# Patient Record
Sex: Female | Born: 1990 | Hispanic: No | Marital: Single | Smoking: Current every day smoker
Health system: Southern US, Community
[De-identification: ages and names within clinical notes are randomized; demographics above are authoritative.]

## PROBLEM LIST (undated history)

## (undated) DIAGNOSIS — C801 Malignant (primary) neoplasm, unspecified: Secondary | ICD-10-CM

## (undated) DIAGNOSIS — Z789 Other specified health status: Secondary | ICD-10-CM

## (undated) HISTORY — PX: BRAIN SURGERY: SHX531

## (undated) HISTORY — PX: TYMPANOPLASTY: SHX33

## (undated) HISTORY — DX: Malignant (primary) neoplasm, unspecified: C80.1

---

## 2015-04-27 ENCOUNTER — Ambulatory Visit (INDEPENDENT_AMBULATORY_CARE_PROVIDER_SITE_OTHER): Payer: PRIVATE HEALTH INSURANCE | Admitting: Family Medicine

## 2015-04-27 VITALS — BP 104/70 | HR 84 | Temp 98.9°F | Resp 16 | Ht 63.75 in | Wt 178.2 lb

## 2015-04-27 DIAGNOSIS — N63 Unspecified lump in unspecified breast: Secondary | ICD-10-CM

## 2015-04-27 DIAGNOSIS — N912 Amenorrhea, unspecified: Secondary | ICD-10-CM | POA: Diagnosis not present

## 2015-04-27 DIAGNOSIS — Z79899 Other long term (current) drug therapy: Secondary | ICD-10-CM | POA: Diagnosis not present

## 2015-04-27 DIAGNOSIS — Z793 Long term (current) use of hormonal contraceptives: Secondary | ICD-10-CM

## 2015-04-27 LAB — POCT URINE PREGNANCY: Preg Test, Ur: NEGATIVE

## 2015-04-27 NOTE — Progress Notes (Signed)
  Subjective:  Patient ID: Virginia Holt, female    DOB: 1991-10-13  Age: 24 y.o. MRN: [id number]  24 year old lady who presents with a lump in her left breast she noticed a few days ago. She just happened to feel it. It was nontender and did not call attention to itself. She has not had any breast lumps in the past. Her last menstrual cycle was in April. She is on ninety-day cycling of birth control pills at that time. However she quit them a week or 2 ago. She has broken up with her boyfriend do not see any need for being on them any longer. She has used protection also she's had intercourse. She did run a pregnancy test or suffer couple days ago and it was negative.  The family history is positive for a great-grandmother with breast cancer and she thinks maybe an aunt had breast cancer. No first-degree relative cancers. No other symptoms or complaints. The patient is a [employer].   Objective:   Healthy-appearing young lady in no acute distress. No axillary nodes. Just below the left axilla however almost more axillary been depressed, is a 1 by a 1/2 cm moderately firm nodular area, noncystic. She has some other minimal fibrocystic nodular areas in both breasts. There is no dimpling of the skin.  Assessment & Plan:   Assessment: Left breast lump Amenorrhea secondary to contraception  Plan: Explained to her that we still need to do a pregnancy test on her. We'll schedule a breast ultrasound and evaluation of center. Results for orders placed or performed in visit on 04/27/15  POCT urine pregnancy  Result Value Ref Range   Preg Test, Ur Negative     Patient Instructions  You're being referred to the breast center for evaluation for the lump. We are ordering an ultrasound, but the may also need to mammogram.  If you do not hear back from this office regarding the referral within the next 3 days or so please call back to check and see what has become of it.     Doyt Castellana, MD  04/27/2015

## 2015-04-27 NOTE — Patient Instructions (Signed)
You're being referred to the breast center for evaluation for the lump. We are ordering an ultrasound, but the may also need to mammogram.  If you do not hear back from this office regarding the referral within the next 3 days or so please call back to check and see what has become of it.

## 2015-05-03 ENCOUNTER — Other Ambulatory Visit: Payer: Self-pay | Admitting: Family Medicine

## 2015-05-03 DIAGNOSIS — N6320 Unspecified lump in the left breast, unspecified quadrant: Secondary | ICD-10-CM

## 2015-05-06 ENCOUNTER — Ambulatory Visit (INDEPENDENT_AMBULATORY_CARE_PROVIDER_SITE_OTHER): Payer: PRIVATE HEALTH INSURANCE

## 2015-05-06 ENCOUNTER — Telehealth: Payer: Self-pay | Admitting: Physician Assistant

## 2015-05-06 ENCOUNTER — Ambulatory Visit (INDEPENDENT_AMBULATORY_CARE_PROVIDER_SITE_OTHER): Payer: PRIVATE HEALTH INSURANCE | Admitting: Physician Assistant

## 2015-05-06 VITALS — BP 98/64 | HR 91 | Temp 98.1°F | Resp 16 | Ht 63.75 in | Wt 174.6 lb

## 2015-05-06 DIAGNOSIS — R700 Elevated erythrocyte sedimentation rate: Secondary | ICD-10-CM | POA: Diagnosis not present

## 2015-05-06 DIAGNOSIS — R591 Generalized enlarged lymph nodes: Secondary | ICD-10-CM | POA: Diagnosis not present

## 2015-05-06 LAB — POCT SEDIMENTATION RATE: POCT SED RATE: 113 mm/hr — AB (ref 0–22)

## 2015-05-06 LAB — POCT CBC
Granulocyte percent: 69.2 %G (ref 37–80)
HEMATOCRIT: 40.6 % (ref 37.7–47.9)
HEMOGLOBIN: 12.6 g/dL (ref 12.2–16.2)
Lymph, poc: 1.5 (ref 0.6–3.4)
MCH, POC: 26.1 pg — AB (ref 27–31.2)
MCHC: 31.1 g/dL — AB (ref 31.8–35.4)
MCV: 83.8 fL (ref 80–97)
MID (cbc): 0.4 (ref 0–0.9)
MPV: 6.7 fL (ref 0–99.8)
POC Granulocyte: 4.4 (ref 2–6.9)
POC LYMPH PERCENT: 24.6 %L (ref 10–50)
POC MID %: 6.2 %M (ref 0–12)
Platelet Count, POC: 252 10*3/uL (ref 142–424)
RBC: 4.85 M/uL (ref 4.04–5.48)
RDW, POC: 14.6 %
WBC: 6.3 10*3/uL (ref 4.6–10.2)

## 2015-05-06 LAB — POCT URINE PREGNANCY: Preg Test, Ur: NEGATIVE

## 2015-05-06 NOTE — Progress Notes (Signed)
Patient ID: Virginia Holt, female    DOB: Jul 15, 1991, 24 y.o.   MRN: [id number]  PCP: No PCP Per Patient   Subjective:  HPI Pt is a 24 y/o female presenting to clinic for evaluation of lymphadenopathy.  Pt was seen by Dr. Linna Darner 1 week ago, when a small nodule was palpated in left breast/axillary area. Pt was sent for an ultrasound for evaluation. Ultrasound showed evidence of several small masses in the right axilla and a 1.5cm mass in the left axilla, all suspicious for malignancy, and breast tissue was normal. Dr. Marcelo Baldy at [facility], who read the ultrasound, contacted [name], PA-C to discuss performing an ultrasound guided biopsy, and it was decided that she should undergo evaluation for lymphadenopathy before performing a biopsy.   Pt states that she has been feeling fatigued for about a week, but she attributed that to working more. She also feels as though she has been more hungry and thirsty that usual, but has lost 5 lbs unintentionally over the last month. The skin around both nipples has been more dry than usual for the past 2 weeks, but she is experiencing no breast pain or nipple discharge. She also experienced some dizziness, light-headedness, and nausea last night, which lasted about 3 hours. She did not associated anything with the occurrence of this episode, and it resolved on its own. She has not experienced another episode like this. Of note, pt was diagnosed with brain cancer and had brain surgery at age 8, but doesn't really remember anything and has no further information to share.  She states that her tonsils have felt swollen, and her left tonsil started hurting today. This is a normal occurrence for her, and it usually happens 2-3 times a year. Positive for rhinorrhea, but no other HEENT symptoms.    Review of Systems  Constitutional: Positive for appetite change (more hungry recently), fatigue (for a week) and unexpected weight change (unintentional 5  pound weight loss in the last month). Negative for fever, chills, diaphoresis and activity change.  HENT: Positive for rhinorrhea. Negative for congestion, dental problem, drooling, ear discharge, ear pain, facial swelling, hearing loss, mouth sores, nosebleeds, postnasal drip, sinus pressure, sneezing, sore throat, tinnitus and trouble swallowing.   Eyes: Negative.   Respiratory: Negative.   Cardiovascular: Negative.   Gastrointestinal: Positive for nausea (for 3 hours last night). Negative for vomiting, abdominal pain, diarrhea, constipation, blood in stool, abdominal distention, anal bleeding and rectal pain.  Endocrine: Positive for polydipsia and polyphagia. Negative for heat intolerance and polyuria.  Genitourinary: Negative.   Musculoskeletal: Negative.   Skin: Negative.   Neurological: Positive for dizziness (for 3 hours last night) and light-headedness (for 3 hours last night). Negative for tremors, seizures, syncope, facial asymmetry, speech difficulty, weakness, numbness and headaches.  Hematological: Negative.      There are no active problems to display for this patient.   Past Medical History  Diagnosis Date  . Cancer     Prior to Admission medications   Not on File    No Known Allergies  Past Medical, Surgical Family and Social History reviewed and updated.   Objective:   Vitals: BP 98/64 mmHg  Pulse 91  Temp(Src) 98.1 F (36.7 C) (Oral)  Resp 16  Ht 5' 3.75" (1.619 m)  Wt 174 lb 9.6 oz (79.198 kg)  BMI 30.21 kg/m2  SpO2 98%  LMP 03/12/2015 (Exact Date)   Physical Exam  Constitutional: She is oriented to person, place, and time. She  appears well-developed and well-nourished. She is cooperative. No distress.  HENT:  Head: Normocephalic and atraumatic.  Right Ear: Hearing, external ear and ear canal normal. No lacerations. No drainage, swelling or tenderness. No foreign bodies. No mastoid tenderness. Tympanic membrane is scarred (HIstory of  tympanoplasty). Tympanic membrane is not injected, not perforated, not erythematous, not retracted and not bulging. No middle ear effusion. No hemotympanum. No decreased hearing is noted.  Left Ear: External ear and ear canal normal. No lacerations. No drainage, swelling or tenderness. No foreign bodies. No mastoid tenderness. 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 Tympanic membrane is not injected, not scarred, not erythematous, not retracted and not bulging. Tympanic membrane mobility is normal.  No middle ear effusion. No hemotympanum. No decreased hearing is noted.  Nose: Nose normal. Right sinus exhibits no maxillary sinus tenderness and no frontal sinus tenderness. Left sinus exhibits no maxillary sinus tenderness and no frontal sinus tenderness.  Mouth/Throat: Uvula is midline, oropharynx is clear and moist and mucous membranes are normal.  Hypertrophic tonsils, right >left. No erythema, drainage, or exudate.  Eyes: Conjunctivae and lids are normal. Pupils are equal, round, and reactive to light.  Neck: Trachea normal and phonation normal. Neck supple. No thyromegaly present.  Cardiovascular: Normal rate, regular rhythm, normal heart sounds and normal pulses.  Exam reveals no gallop and no friction rub.   No murmur heard. Pulmonary/Chest: Effort normal and breath sounds normal. No respiratory distress. She has no wheezes. She has no rales. Right breast exhibits skin change (Dry, scaly nipple). Right breast exhibits no inverted nipple, no mass, no nipple discharge and no tenderness. Left breast exhibits skin change (Dry, scaly nipple). Left breast exhibits no inverted nipple, no mass, no nipple discharge and no tenderness. Breasts are symmetrical.  Abdominal: Soft. Normal appearance and bowel sounds are normal. She exhibits no distension and no mass. There is no hepatosplenomegaly. There is no tenderness.  Stretch marks visible  Lymphadenopathy:       Head (right side):  Tonsillar adenopathy present. No submental, no submandibular, no preauricular, no posterior auricular and no occipital adenopathy present.       Head (left side): No submental, no submandibular, no tonsillar, no preauricular, no posterior auricular and no occipital adenopathy present.    She has cervical adenopathy.       Right cervical: Superficial cervical and deep cervical adenopathy present. No posterior cervical adenopathy present.      Left cervical: Superficial cervical and deep cervical adenopathy present. No posterior cervical adenopathy present.    She has axillary adenopathy.       Right axillary: No pectoral and no lateral adenopathy present.       Left axillary: Pectoral adenopathy present. No lateral adenopathy present.      Right: No supraclavicular adenopathy present.       Left: No supraclavicular adenopathy present.  Inguinal lymph nodes palpable, but not enlarged.  Neurological: She is alert and oriented to person, place, and time. She has normal reflexes.  Skin: Skin is warm, dry and intact. No rash noted.  Psychiatric: She has a normal mood and affect. Her speech is normal and behavior is normal. Thought content normal.    Results for orders placed or performed in visit on 05/06/15  POCT CBC  Result Value Ref Range   WBC 6.3 4.6 - 10.2 K/uL   Lymph, poc 1.5 0.6 - 3.4   POC LYMPH PERCENT 24.6 10 - 50 %L   MID (cbc) 0.4 0 - 0.9  POC MID % 6.2 0 - 12 %M   POC Granulocyte 4.4 2 - 6.9   Granulocyte percent 69.2 37 - 80 %G   RBC 4.85 4.04 - 5.48 M/uL   Hemoglobin 12.6 12.2 - 16.2 g/dL   HCT, POC 40.6 37.7 - 47.9 %   MCV 83.8 80 - 97 fL   MCH, POC 26.1 (A) 27 - 31.2 pg   MCHC 31.1 (A) 31.8 - 35.4 g/dL   RDW, POC 14.6 %   Platelet Count, POC 252 142 - 424 K/uL   MPV 6.7 0 - 99.8 fL  POCT SEDIMENTATION RATE  Result Value Ref Range   POCT SED RATE 113 (A) 0 - 22 mm/hr  POCT urine pregnancy  Result Value Ref Range   Preg Test, Ur Negative Negative   [facility] reading  (PRIMARY) by  Dr. Tamala Julian: No acute disease, no perihilar adenopathy.   Assessment & Plan:   Graylee was seen today for follow-up.  Diagnoses and all orders for this visit:  Lymphadenopathy       - No known cause for lymphadenopathy at this time, further work up needed.        - Await results.        - CT scan of chest, abdomen, and pelvis to evaluate for any further, non-palpable lymphadenopathy.        - Will likely need a biopsy, use CT to determine which lymph node to biopsy. Orders: -     POCT CBC -     TSH -     COMPLETE METABOLIC PANEL WITH GFR -     POCT SEDIMENTATION RATE - elevated -     DG Chest 2 View; Future - no acute disease, no perihilar adenopathy -     POCT urine pregnancy - negative -     CT Abdomen Pelvis W Contrast; Future -     CT Chest W Contrast; Future  Elevated sed rate Orders: -     CT Abdomen Pelvis W Contrast; Future -     CT Chest W Contrast; Future  Sherrica Niehaus, PA-S [facility] 05/06/2015 9:35 PM

## 2015-05-06 NOTE — Telephone Encounter (Signed)
Got a phone call from Dr Ladonna Snide at [facility].her breast tissue appeared ok but the lump appears to be possible lymph tissue.  He was calling to see if we wanted to biopsy vs a more indepth work-up because her story to him was that she gets swollen lymph nodes intermittent that she does not seek care for.  I d/w him to instruct the patient to RTC for further work-up of CBC, CMET and possible further imaging.

## 2015-05-06 NOTE — Progress Notes (Signed)
Virginia Holt  MRN: [id number] DOB: 1991-08-05  Subjective:  Pt presents to clinic for evaluation of lymphadenopathy.  Pt was seen by Dr. Linna Holt 1 week ago, when a small nodule was palpated in left breast/axillary area. Pt was sent for an ultrasound for evaluation. Ultrasound showed multiple enlarged lymph nodes in the right axilla and a 1.5cm mass in the left axilla, all suspicious for malignancy, and breast tissue was normal. Dr. Marcelo Holt at [facility], who read the ultrasound, contacted Virginia Hummingbird, PA-C to discuss performing an ultrasound guided biopsy, and it was decided that she should undergo evaluation for lymphadenopathy before performing a biopsy.  Pt states that she has been feeling fatigued for about a week, but she attributed that to working more. She also feels as though she has been more hungry and thirsty that usual, but has lost 5 lbs unintentionally over the last month. The skin around both nipples has been more dry than usual for the past 2 weeks, but she is experiencing no breast pain or nipple discharge. She also experienced some dizziness, light-headedness, and nausea last night, which lasted about 3 hours. She did not associated anything with the occurrence of this episode, and it resolved on its own. She has not experienced another episode like this. Of note, pt was diagnosed with brain cancer and had brain surgery at age 27, but doesn't really remember anything and has no further information to share.  She states that her tonsils have felt swollen, and her left tonsil started hurting today. This is a normal occurrence for her, and it usually happens 2-3 times a year. Positive for rhinorrhea, but no other HEENT symptoms.   Review of Systems  Constitutional: Positive for appetite change (more hungry recently), fatigue (for a week) and unexpected weight change (unintentional 5 pound weight loss in the last month). Negative for fever, chills, diaphoresis and activity change.   HENT: Positive for rhinorrhea. Negative for congestion, dental problem, drooling, ear discharge, ear pain, facial swelling, hearing loss, mouth sores, nosebleeds, postnasal drip, sinus pressure, sneezing, sore throat, tinnitus and trouble swallowing.  Eyes: Negative.  Respiratory: Negative.  Cardiovascular: Negative.  Gastrointestinal: Positive for nausea (for 3 hours last night). Negative for vomiting, abdominal pain, diarrhea, constipation, blood in stool, abdominal distention, anal bleeding and rectal pain.  Endocrine: Positive for polydipsia and polyphagia. Negative for heat intolerance and polyuria.  Genitourinary: Negative.  Musculoskeletal: Negative.  Skin: Negative.  Neurological: Positive for dizziness (for 3 hours last night) and light-headedness (for 3 hours last night). Negative for tremors, seizures, syncope, facial asymmetry, speech difficulty, weakness, numbness and headaches.  Hematological: Negative.   There are no active problems to display for this patient.   No current outpatient prescriptions on file prior to visit.   No current facility-administered medications on file prior to visit.    No Known Allergies  Review of Systems Objective:  BP 98/64 mmHg  Pulse 91  Temp(Src) 98.1 F (36.7 C) (Oral)  Resp 16  Ht 5' 3.75" (1.619 m)  Wt 174 lb 9.6 oz (79.198 kg)  BMI 30.21 kg/m2  SpO2 98%  LMP 03/12/2015 (Exact Date)  Physical Exam  Constitutional: She is oriented to person, place, and time and well-developed, well-nourished, and in no distress.  HENT:  Head: Normocephalic and atraumatic.  Right Ear: Hearing and external ear normal.  Left Ear: Hearing and external ear normal.  Eyes: Conjunctivae are normal.  Neck: Normal range of motion.  Cardiovascular: Normal rate, regular rhythm and normal heart  sounds.   No murmur heard. Pulmonary/Chest: Effort normal and breath sounds normal.  Abdominal: Soft. Bowel sounds are normal.  Lymphadenopathy:       Head  (right side): Tonsillar adenopathy present. No posterior auricular and no occipital adenopathy present.       Head (left side): No tonsillar, no posterior auricular and no occipital adenopathy present.       Right cervical: No superficial cervical and no deep cervical adenopathy present.      Left cervical: No superficial cervical and no deep cervical adenopathy present.    She has axillary adenopathy.       Right axillary: No pectoral and no lateral adenopathy present.       Left axillary: Pectoral adenopathy present. No lateral adenopathy present.      Right: Inguinal (palpable but not tender or enlarged) adenopathy present. No supraclavicular adenopathy present.       Left: Inguinal (palpable but not tender or enlarged) adenopathy present. No supraclavicular adenopathy present.  Neurological: She is alert and oriented to person, place, and time. Gait normal.  Skin: Skin is warm and dry.  Psychiatric: Mood, memory, affect and judgment normal.  Vitals reviewed.   [facility] reading (PRIMARY) by  Dr. Tamala Holt.  NAD.  Results for orders placed or performed in visit on 05/06/15  POCT CBC  Result Value Ref Range   WBC 6.3 4.6 - 10.2 K/uL   Lymph, poc 1.5 0.6 - 3.4   POC LYMPH PERCENT 24.6 10 - 50 %L   MID (cbc) 0.4 0 - 0.9   POC MID % 6.2 0 - 12 %M   POC Granulocyte 4.4 2 - 6.9   Granulocyte percent 69.2 37 - 80 %G   RBC 4.85 4.04 - 5.48 M/uL   Hemoglobin 12.6 12.2 - 16.2 g/dL   HCT, POC 40.6 37.7 - 47.9 %   MCV 83.8 80 - 97 fL   MCH, POC 26.1 (A) 27 - 31.2 pg   MCHC 31.1 (A) 31.8 - 35.4 g/dL   RDW, POC 14.6 %   Platelet Count, POC 252 142 - 424 K/uL   MPV 6.7 0 - 99.8 fL  POCT SEDIMENTATION RATE  Result Value Ref Range   POCT SED RATE 113 (A) 0 - 22 mm/hr  POCT urine pregnancy  Result Value Ref Range   Preg Test, Ur Negative Negative     Assessment and Plan :  Lymphadenopathy - Plan: POCT CBC, TSH, COMPLETE METABOLIC PANEL WITH GFR, POCT SEDIMENTATION RATE, DG Chest 2 View, POCT  urine pregnancy, CT Abdomen Pelvis W Contrast, CT Chest W Contrast  Elevated sed rate - Plan: CT Abdomen Pelvis W Contrast, CT Chest W Contrast   I am concerned that the patient may have lymphoma though this was not distinctly discussed with patient as she seemed really nervous and unsure of herself and I was unsure she would be able to process this information.  The next step is to get a CT of chest, abd and pelvis to see the extend of her lymphadenopathy.  Depending on the CT will determine what we do next in regards to biopsy and which lymph node will need to be biopsied.  We discussed with the patient that this would be reactionary lymph nodes but due to the multiple locations affected we have to worry about a system wide problem and a CT scan will help [place] determine all the lymph nodes that are involved.  We discussed that most likely she will need a  biopsy but the location will be determined after her CT.  She voiced understanding and had no questions.   Virginia Hummingbird PA-C  [facility] 05/06/2015 9:48 PM

## 2015-05-06 NOTE — Patient Instructions (Signed)
We are going to set up a CT scan to look at the lymph node.

## 2015-05-07 LAB — COMPLETE METABOLIC PANEL WITH GFR
ALBUMIN: 3.8 g/dL (ref 3.5–5.2)
ALT: 14 U/L (ref 0–35)
AST: 19 U/L (ref 0–37)
Alkaline Phosphatase: 97 U/L (ref 39–117)
BUN: 9 mg/dL (ref 6–23)
CALCIUM: 9.5 mg/dL (ref 8.4–10.5)
CHLORIDE: 98 meq/L (ref 96–112)
CO2: 27 mEq/L (ref 19–32)
Creat: 0.61 mg/dL (ref 0.50–1.10)
GFR, Est African American: >89 mL/min
GFR, Est Non African American: >89 mL/min
GLUCOSE: 68 mg/dL — AB (ref 70–99)
Potassium: 4 mEq/L (ref 3.5–5.3)
Sodium: 137 mEq/L (ref 135–145)
Total Bilirubin: 0.6 mg/dL (ref 0.2–1.2)
Total Protein: 9.1 g/dL — ABNORMAL HIGH (ref 6.0–8.3)

## 2015-05-07 LAB — TSH: TSH: 0.861 u[IU]/mL (ref 0.350–4.500)

## 2015-05-07 NOTE — Telephone Encounter (Signed)
Pt was seen and evaluated and waiting on CT.

## 2015-05-10 ENCOUNTER — Telehealth: Payer: Self-pay

## 2015-05-10 ENCOUNTER — Other Ambulatory Visit: Payer: Self-pay

## 2015-05-10 NOTE — Telephone Encounter (Signed)
Patient should return to clinic for re-evaluation given that her labs and physical exam have not yet contributed to a diagnosis including signs of bacterial infection. There are several reasons why she could have swollen tonsils and inflamed lymph nodes. Bacterial infection is one of them but she needs to be re-examined for [place] to make an appropriate decision about which antibiotic to use if any. Thank you!

## 2015-05-10 NOTE — Telephone Encounter (Signed)
Please advise 

## 2015-05-10 NOTE — Telephone Encounter (Signed)
Spoke with pt, advised message. Pt is unable to come in due to her bill that she owes from the last visit. [facility] called her with an appt for a CT scan but they do not accept her insurance and she is unable to afford this right now. I will forward to Judson Roch since she saw pt last.

## 2015-05-10 NOTE — Telephone Encounter (Signed)
Pt states her tonsils are swollen and would like the Dr to call her in an antibiotic. Please call pt at [facility]

## 2015-05-12 ENCOUNTER — Ambulatory Visit (INDEPENDENT_AMBULATORY_CARE_PROVIDER_SITE_OTHER): Payer: PRIVATE HEALTH INSURANCE | Admitting: Physician Assistant

## 2015-05-12 VITALS — BP 114/62 | HR 106 | Temp 98.4°F | Resp 16 | Ht 64.0 in | Wt 174.6 lb

## 2015-05-12 DIAGNOSIS — N63 Unspecified lump in unspecified breast: Secondary | ICD-10-CM

## 2015-05-12 DIAGNOSIS — R591 Generalized enlarged lymph nodes: Secondary | ICD-10-CM

## 2015-05-12 DIAGNOSIS — J039 Acute tonsillitis, unspecified: Secondary | ICD-10-CM

## 2015-05-12 DIAGNOSIS — H6692 Otitis media, unspecified, left ear: Secondary | ICD-10-CM

## 2015-05-12 LAB — POCT CBC
Granulocyte percent: 70.6 %G (ref 37–80)
HCT, POC: 38.5 % (ref 37.7–47.9)
Hemoglobin: 12.4 g/dL (ref 12.2–16.2)
Lymph, poc: 1.5 (ref 0.6–3.4)
MCH, POC: 26.4 pg — AB (ref 27–31.2)
MCHC: 32.2 g/dL (ref 31.8–35.4)
MCV: 81.9 fL (ref 80–97)
MID (CBC): 0.4 (ref 0–0.9)
MPV: 6.4 fL (ref 0–99.8)
POC GRANULOCYTE: 4.5 (ref 2–6.9)
POC LYMPH %: 23.8 % (ref 10–50)
POC MID %: 5.6 % (ref 0–12)
Platelet Count, POC: 283 10*3/uL (ref 142–424)
RBC: 4.7 M/uL (ref 4.04–5.48)
RDW, POC: 13.5 %
WBC: 6.4 10*3/uL (ref 4.6–10.2)

## 2015-05-12 LAB — EPSTEIN-BARR VIRUS VCA ANTIBODY PANEL
EBV EA IgG: 6.5 U/mL (ref <9.0)
EBV NA IgG: 59.4 U/mL — ABNORMAL HIGH (ref <18.0)
EBV VCA IgG: 284 U/mL — ABNORMAL HIGH (ref <18.0)
EBV VCA IgM: 10.9 U/mL (ref <36.0)

## 2015-05-12 LAB — POCT RAPID STREP A (OFFICE): RAPID STREP A SCREEN: NEGATIVE

## 2015-05-12 MED ORDER — MAGIC MOUTHWASH W/LIDOCAINE: 10.0000 mL | ORAL | Status: DC | PRN

## 2015-05-12 MED ORDER — AZITHROMYCIN 500 MG PO TABS: 500.0000 mg | ORAL_TABLET | Freq: Every day | ORAL | Status: AC

## 2015-05-12 NOTE — Patient Instructions (Addendum)
Take antibiotic once a day for 5 days. Use mouthwash every 2-3 hours for throat pain. Ibuprofen 800 mg three times a day for pain. I will call you with results of your lab tests. Call [facility] on July 1st and schedule CT.

## 2015-05-12 NOTE — Progress Notes (Signed)
[facility] [address], [place] [address] [phone]  Date:  05/12/2015   Name:  Virginia Holt   DOB:  [birth year]   MRN:  [id number]  PCP:  No PCP Per Patient    Chief Complaint: Sore Throat   History of Present Illness:  This is a 24 y.o. female with PMH hx brain cancer age 72 who is presenting for follow up sore throat. Has been having pain x 1 week. Has had nasal congestion x 2 days. Not having any cough. Hurts to swallow but is able. States it hurts to move her neck d/t swollen lymph nodes. Neck hurts anteriorly. She is also complaining of sharp left ear pain x 1.5 weeks. She feels the pain is starting in her left ear as well. She notes she has perforation in her right TM. She used to have a perforation in her left TM as well but got this repaired. Pain in her ear started after swimming in a pool without ear plugs. Denies fever, chills or headache. Never had mono. Never had strep throat. No sick contacts. Has been trying antiseptic spray, aleve, hot tea and water. Antiseptic spray helps some. Pt feels her symptoms are worsening since onset.   Pt was seen here on 05/06/15 by [name], PA-C. She was following up on results of ultrasound of left breast mass. U/s showed multiple enlarged lymph nodes in the right axilla and a 1.5cm mass in the left axilla, all suspicious for malignancy. Dr. Marcelo Baldy at [facility], who read the ultrasound, contacted [name], PA-C to discuss performing an ultrasound guided biopsy, and it was decided that she should undergo evaluation for lymphadenopathy before performing a biopsy. Work up on 6/16  Elevated ESR 113, CMP nml, TSH nml, CBC wnl, urine hcg neg. It was determined she should undergo CT chest and abdomen before u/s guided bx. Pt stating today she was unable to get CT done at [facility] as they do not take her insurance. However, she is getting new insurance on July 1st and was told they do take that insurance.  She plans to call on July 1st and get CTs scheduled then. Dryness noted on bilateral nipples at last visit. She has been applying lotion QD and dryness improving. She has been feeling her bilateral axillas and thinks the lymph nodes are going down in size. Pt knows she had brain surgery for a brain tumor when she was 7 but does not recall the type of brain cancer or know of getting any other treatment. No weight loss since last visit. Denies fever, chills, night sweats or pruritus.  Review of Systems:  Review of Systems See HPI  There are no active problems to display for this patient.   Prior to Admission medications   Medication Sig Start Date End Date Taking? Authorizing Provider  naproxen sodium (ANAPROX) 220 MG tablet Take 220 mg by mouth 2 (two) times daily with a meal.   Yes Historical Provider, MD    No Known Allergies  Past Surgical History  Procedure Laterality Date  . Brain surgery    . Tympanoplasty Right     History  Substance Use Topics  . Smoking status: Never Smoker   . Smokeless tobacco: Never Used  . Alcohol Use: 0.0 oz/week    0 Standard drinks or equivalent per week     Comment: social use    History reviewed. No pertinent family history.  Medication list has been reviewed and  updated.  Physical Examination:  Physical Exam  Constitutional: She is oriented to person, place, and time. She appears well-developed and well-nourished. No distress.  HENT:  Head: Normocephalic and atraumatic.  Right Ear: Hearing, external ear and ear canal normal. Tympanic membrane is retracted.  Left Ear: Hearing, external ear and ear canal normal.  Nose: Mucosal edema present. Right sinus exhibits no maxillary sinus tenderness and no frontal sinus tenderness. Left sinus exhibits no maxillary sinus tenderness and no frontal sinus tenderness.  Mouth/Throat: Uvula is midline and mucous membranes are normal. No oropharyngeal exudate or tonsillar abscesses.  Left TM with large  perforation with smooth edges. Purulence noted in middle ear. No discharge in canal  Left tonsil mildly erythematous but very enlarged to 4+ Right tonsil not erythematous and 2-3+ in size Uvula midline  Eyes: Conjunctivae and lids are normal. Right eye exhibits no discharge. Left eye exhibits no discharge. No scleral icterus.  Cardiovascular: Regular rhythm, normal heart sounds and normal pulses.   No murmur heard. Mild tachycardia  Pulmonary/Chest: Effort normal and breath sounds normal. No respiratory distress. She has no wheezes. She has no rhonchi. She has no rales.  Abdominal: Soft. Normal appearance. There is no tenderness.  Genitourinary: No breast swelling, tenderness, discharge or bleeding.  Bilateral nipples with mild scaling and dryness, L>R  Musculoskeletal: Normal range of motion.  Lymphadenopathy:       Head (right side): No submental, no submandibular, no tonsillar, no preauricular, no posterior auricular and no occipital adenopathy present.       Head (left side): No submental, no submandibular, no tonsillar, no preauricular, no posterior auricular and no occipital adenopathy present.    She has cervical adenopathy (large bilateral anterior cervical and deep cervical).       Right: No inguinal and no supraclavicular adenopathy present.       Left: No inguinal and no supraclavicular adenopathy present.  No axillary lymphadenopathy appreciated I had pt palpate where axillary lymphadenopathy had been before and she could no longer feel either  Neurological: She is alert and oriented to person, place, and time.  Skin: Skin is warm, dry and intact. No lesion and no rash noted.  Psychiatric: She has a normal mood and affect. Her speech is normal and behavior is normal. Thought content normal.   BP 114/62 mmHg  Pulse 106  Temp(Src) 98.4 F (36.9 C) (Oral)  Resp 16  Ht '5\' 4"'  (1.626 m)  Wt 174 lb 9.6 oz (79.198 kg)  BMI 29.96 kg/m2  SpO2 98%  LMP 03/12/2015  Results for  orders placed or performed in visit on 05/12/15  POCT rapid strep A  Result Value Ref Range   Rapid Strep A Screen Negative Negative  POCT CBC  Result Value Ref Range   WBC 6.4 4.6 - 10.2 K/uL   Lymph, poc 1.5 0.6 - 3.4   POC LYMPH PERCENT 23.8 10 - 50 %L   MID (cbc) 0.4 0 - 0.9   POC MID % 5.6 0 - 12 %M   POC Granulocyte 4.5 2 - 6.9   Granulocyte percent 70.6 37 - 80 %G   RBC 4.70 4.04 - 5.48 M/uL   Hemoglobin 12.4 12.2 - 16.2 g/dL   HCT, POC 38.5 37.7 - 47.9 %   MCV 81.9 80 - 97 fL   MCH, POC 26.4 (A) 27 - 31.2 pg   MCHC 32.2 31.8 - 35.4 g/dL   RDW, POC 13.5 %   Platelet Count, POC 283 142 -  424 K/uL   MPV 6.4 0 - 99.8 fL    Assessment and Plan:  1. Acute tonsillitis 2. Lymphadenopathy 3. Acute left OM 4. Breast mass Rapid strep negative, culture pending. CBC wnl. EBV pending. She will take zithromax 500 mg QD x 5 days for acute OM. Magic mouthwash and ibuprofen for pain. Lymphadenopathy in left axilla not appreciated this visit. We discussed the importance of her calling [facility] July 1 to make appt for CT abdomen and chest d/t mass in left breast on u/s suspicious for malignancy. An ESR of 113 also suspicious for malignancy. Will call pt in a few days to check up on her. - POCT rapid strep A - POCT CBC - Epstein-Barr virus VCA antibody panel - Culture, Group A Strep - Alum & Mag Hydroxide-Simeth (MAGIC MOUTHWASH W/LIDOCAINE) SOLN; Take 10 mLs by mouth every 2 (two) hours as needed for mouth pain.  Dispense: 360 mL; Refill: 0 - azithromycin (ZITHROMAX) 500 MG tablet; Take 1 tablet (500 mg total) by mouth daily.  Dispense: 5 tablet; Refill: 0  Benjaman Pott. Drenda Freeze, MHS [facility]  05/12/2015

## 2015-05-13 LAB — CULTURE, GROUP A STREP: Organism ID, Bacteria: NORMAL

## 2015-05-17 NOTE — Telephone Encounter (Signed)
Called and spoke with patient.  The lymph node is still gone under her left arm and the nodes in her neck area are much better.  She feels fine.  She is getting [employer] that becomes active on July 1st - I asked her to call and schedule her CT for early next week and she has access to her health insurance information and to give it to [facility] when she schedules the appt to make determine how much her insurance will cover of the CT scan.  I am still not sure I understand why her current insurance would not pay, I suspect it might be a deductible situation.  She will call me if she has problems.

## 2015-06-10 ENCOUNTER — Ambulatory Visit
Admission: RE | Admit: 2015-06-10 | Discharge: 2015-06-10 | Disposition: A | Discharge status: Home / Self Care | Payer: PRIVATE HEALTH INSURANCE | Source: Ambulatory Visit | Attending: Physician Assistant | Admitting: Physician Assistant

## 2015-06-10 DIAGNOSIS — R700 Elevated erythrocyte sedimentation rate: Secondary | ICD-10-CM

## 2015-06-10 DIAGNOSIS — R591 Generalized enlarged lymph nodes: Secondary | ICD-10-CM

## 2015-06-10 MED ORDER — IOPAMIDOL (ISOVUE-300) INJECTION 61%
100.0000 mL | Freq: Once | INTRAVENOUS | Status: AC | PRN
  Administered 2015-06-10: 100 mL via INTRAVENOUS

## 2015-06-17 ENCOUNTER — Telehealth: Payer: Self-pay | Admitting: Physician Assistant

## 2015-06-17 DIAGNOSIS — R1900 Intra-abdominal and pelvic swelling, mass and lump, unspecified site: Secondary | ICD-10-CM

## 2015-06-17 NOTE — Telephone Encounter (Signed)
Called pt and left VM to call back. Will need to order MRI to further evaluate fatty mass seen on abdominal CT.

## 2015-06-18 NOTE — Telephone Encounter (Signed)
Pt informed of CT results. She is wary of getting an MRI d/t cost. CT cost $1300 out of pocket, her insurance covered 50%. She does know that it is important to get MRI and we will look to see if a different location would be cheaper.

## 2015-06-18 NOTE — Telephone Encounter (Signed)
I called her insurance and I could not get through to a live person.

## 2015-06-18 NOTE — Telephone Encounter (Signed)
Pt returned your call.  Please call back at [phone]

## 2015-06-18 NOTE — Telephone Encounter (Signed)
I called [facility] and they will call her and schedule appt. MRI is expensive across the board.

## 2015-06-21 ENCOUNTER — Ambulatory Visit (INDEPENDENT_AMBULATORY_CARE_PROVIDER_SITE_OTHER): Payer: PRIVATE HEALTH INSURANCE | Admitting: Physician Assistant

## 2015-06-21 VITALS — BP 100/64 | HR 73 | Temp 98.3°F | Resp 20 | Ht 63.5 in | Wt 170.2 lb

## 2015-06-21 DIAGNOSIS — R591 Generalized enlarged lymph nodes: Secondary | ICD-10-CM | POA: Diagnosis not present

## 2015-06-21 DIAGNOSIS — J351 Hypertrophy of tonsils: Secondary | ICD-10-CM | POA: Diagnosis not present

## 2015-06-21 DIAGNOSIS — R935 Abnormal findings on diagnostic imaging of other abdominal regions, including retroperitoneum: Secondary | ICD-10-CM

## 2015-06-21 DIAGNOSIS — R222 Localized swelling, mass and lump, trunk: Secondary | ICD-10-CM

## 2015-06-21 DIAGNOSIS — R229 Localized swelling, mass and lump, unspecified: Secondary | ICD-10-CM

## 2015-06-21 DIAGNOSIS — R599 Enlarged lymph nodes, unspecified: Secondary | ICD-10-CM

## 2015-06-21 NOTE — Patient Instructions (Signed)
Call [facility] tomorrow to schedule MRI. You will get a phone call to make appointment with ENT. Use magic mouthwash and aleve and/or tylenol for pain. Send me a Pharmacist, community message with questions/concerns.

## 2015-06-21 NOTE — Progress Notes (Signed)
[facility] [address], [place] [address] [phone]  Date:  06/21/2015   Name:  Virginia Holt   DOB:  February 28, 1991   MRN:  [id number]  PCP:  No PCP Per Patient    Chief Complaint: Enlarged Tonsils   History of Present Illness:  This is a 24 y.o. female with PMH enlarged lymph nodes and hx brain cancer age 73 who is presenting wanting a referral to ENT for enlarged tonsils. She has had more frequent infections in the past 6 months. Prior to 6 months ago she had tonsillitis twice a year. Seems now to be happening every 3 months or so. She states last night her throat starting hurting. She feels the need to cough from it. Denies nasal congestion, fever or chills.   We have been following patient for swollen cervical lymph nodes, axillary lymph nodes and ESR on 113. First saw patient for these issues on 04/27/15. Strep culture, CBC EBV, TSH and CMP all negative. Sent for CT chest/abdomen/pelvis to evaluate - lymph nodes all normal in size but a large left paraspinal fatty mass discovered. Radiologist recommended MRI for further evaluation. Pt was initially wary of getting an MRI d/t cost but grandmother has convinced pt she needs to have this done d/t her hx brain cancer. Pt received phone call today from [facility] to make appt but she missed the phone call. She will call tomorrow to schedule. Pt states her axillary lymph nodes have decreased in size. She feels her cervical lymph nodes have stayed the same. She has had 4 pound weight loss in past 6 weeks. She denies fever or chills.   Review of Systems:  Review of Systems See HPI  There are no active problems to display for this patient.   Prior to Admission medications   Medication Sig Start Date End Date Taking? Authorizing Provider  ibuprofen (ADVIL,MOTRIN) 200 MG tablet Take 400 mg by mouth every 6 (six) hours as needed.   Yes Historical Provider, MD    No Known Allergies  Past Surgical History   Procedure Laterality Date  . Brain surgery    . Tympanoplasty Right     History  Substance Use Topics  . Smoking status: Never Smoker   . Smokeless tobacco: Never Used  . Alcohol Use: 0.0 oz/week    0 Standard drinks or equivalent per week     Comment: social use    History reviewed. No pertinent family history.  Medication list has been reviewed and updated.  Physical Examination:  Physical Exam  Constitutional: She is oriented to person, place, and time. She appears well-developed and well-nourished. No distress.  HENT:  Head: Normocephalic and atraumatic.  Right Ear: Hearing, tympanic membrane, external ear and ear canal normal.  Left Ear: Hearing, external ear and ear canal normal.  Nose: Right sinus exhibits no maxillary sinus tenderness and no frontal sinus tenderness. Left sinus exhibits no maxillary sinus tenderness and no frontal sinus tenderness.  Mouth/Throat: Uvula is midline and mucous membranes are normal. Posterior oropharyngeal edema present. No oropharyngeal exudate, posterior oropharyngeal erythema or tonsillar abscesses.  Left ear chronic TM perforation, no purulence noted  Tonsils enlarged, R>L. R tonsil 4+, L tonsil 3+  Eyes: Conjunctivae and lids are normal. Right eye exhibits no discharge. Left eye exhibits no discharge. No scleral icterus.  Neck: Trachea normal.  Cardiovascular: Normal rate, regular rhythm, normal heart sounds and normal pulses.   No murmur heard. Pulmonary/Chest: Effort normal and breath  sounds normal. No respiratory distress. She has no wheezes. She has no rhonchi. She has no rales.  Abdominal: Soft. Normal appearance. There is no tenderness.  Genitourinary: No breast swelling, tenderness, discharge or bleeding.  Left breast outer quandrant with palpable node that is mobile and nontender, <1 cm. No masses palpated in right breast/axilla  Musculoskeletal: Normal range of motion.  Lymphadenopathy:       Head (right side): No  submental, no submandibular, no tonsillar and no occipital adenopathy present.       Head (left side): No submental, no submandibular, no tonsillar and no occipital adenopathy present.    She has cervical adenopathy.       Right cervical: Superficial cervical adenopathy present.       Left cervical: Superficial cervical adenopathy present.       Right: Supraclavicular adenopathy present.       Left: No supraclavicular adenopathy present.  Neurological: She is alert and oriented to person, place, and time.  Skin: Skin is warm, dry and intact.  Large thoracic mass palpated, measuring about 5-6 inches in diameter. When standing straight, mass is soft like a lipoma. When flexed, mass is hard with some discrete nodules palpated throughout. No tenderness.  Psychiatric: She has a normal mood and affect. Her speech is normal and behavior is normal. Thought content normal.   BP 100/64 mmHg  Pulse 73  Temp(Src) 98.3 F (36.8 C) (Oral)  Resp 20  Ht 5' 3.5" (1.613 m)  Wt 170 lb 4 oz (77.225 kg)  BMI 29.68 kg/m2  SpO2 98%  LMP 06/14/2015  Assessment and Plan:  1. Enlarged tonsils Tonsils not erythematous and without exudate. Past throat cultures have been negative. No abx started today. Referred to ENT. - Ambulatory referral to ENT  2. Abnormal CT of the abdomen 3. Enlarged lymph nodes 4. Mass on back Pt planning to call to schedule appt for MRI on 06/22/15 to evaluate back mass and rule out sarcoma vs large lipoma. Back mass found on exam today. Soft with standing straight but harder with flexing and some nodules palpated throughout. Will contact when MRI resulted.   Benjaman Pott Drenda Freeze, MHS [facility]  06/22/2015

## 2015-06-22 DIAGNOSIS — R935 Abnormal findings on diagnostic imaging of other abdominal regions, including retroperitoneum: Secondary | ICD-10-CM | POA: Insufficient documentation

## 2015-06-22 DIAGNOSIS — J351 Hypertrophy of tonsils: Secondary | ICD-10-CM | POA: Insufficient documentation

## 2015-06-22 DIAGNOSIS — R222 Localized swelling, mass and lump, trunk: Secondary | ICD-10-CM | POA: Insufficient documentation

## 2015-06-22 DIAGNOSIS — R599 Enlarged lymph nodes, unspecified: Secondary | ICD-10-CM | POA: Insufficient documentation

## 2015-06-22 NOTE — Telephone Encounter (Signed)
Spoke with pt, she states she was contacted by [facility]. She contacted her insurance and they state it would be the same price anywhere. She states she will try to get it done today or tomorrow.

## 2015-06-22 NOTE — Telephone Encounter (Signed)
Called pt to check status. Left message for pt to call back.

## 2015-06-28 ENCOUNTER — Ambulatory Visit
Admission: RE | Admit: 2015-06-28 | Discharge: 2015-06-28 | Disposition: A | Discharge status: Home / Self Care | Payer: PRIVATE HEALTH INSURANCE | Source: Ambulatory Visit | Attending: Physician Assistant | Admitting: Physician Assistant

## 2015-06-28 ENCOUNTER — Inpatient Hospital Stay: Admission: RE | Admit: 2015-06-28 | Payer: PRIVATE HEALTH INSURANCE | Source: Ambulatory Visit

## 2015-06-28 DIAGNOSIS — R1900 Intra-abdominal and pelvic swelling, mass and lump, unspecified site: Secondary | ICD-10-CM

## 2015-06-28 MED ORDER — GADOBENATE DIMEGLUMINE 529 MG/ML IV SOLN
15.0000 mL | Freq: Once | INTRAVENOUS | Status: AC | PRN
  Administered 2015-06-28: 15 mL via INTRAVENOUS

## 2015-06-30 ENCOUNTER — Telehealth: Payer: Self-pay | Admitting: Physician Assistant

## 2015-06-30 DIAGNOSIS — R222 Localized swelling, mass and lump, trunk: Secondary | ICD-10-CM

## 2015-06-30 NOTE — Telephone Encounter (Signed)
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 She states she has continued to lose weight the past few months. Sed rate 8 weeks ago of 113. She will return for follow up with me in the next couple weeks. Referral made to spinal surgeon for evaluation of fatty mass on her back.

## 2015-06-30 NOTE — Telephone Encounter (Signed)
Referral placed.

## 2015-07-21 ENCOUNTER — Telehealth: Payer: Self-pay | Admitting: Physician Assistant

## 2015-07-21 DIAGNOSIS — R599 Enlarged lymph nodes, unspecified: Secondary | ICD-10-CM

## 2015-07-21 NOTE — Telephone Encounter (Signed)
Radiologist, Dr. Christene Slates, called and spoke with Dr. Linna Darner after further reviewing chest CT done on 06/10/15. He feels the architecture and arrangement of the lymph nodes require further evaluation to rule out malignancy. He suggested FNA bx to evaluate. Called patient to discuss. She understands but is very worried about cost. I called and left message on Dr. Leota Sauers cell phone letting him know I spoke with pt. I have placed order for bx. His office will call to schedule appt for this.  I sent her to orthopedic surgery to evaluate benign looking (by MRI) fatty mass in paraspinal muscles. They have suggested a bx of the mass and she is planning to get this done at [facility], although not yet scheduled.  She states she is feeling well. No new sx.

## 2015-07-29 ENCOUNTER — Encounter: Payer: Self-pay | Admitting: Family Medicine

## 2015-07-30 ENCOUNTER — Telehealth: Payer: Self-pay | Admitting: Physician Assistant

## 2015-07-30 DIAGNOSIS — R599 Enlarged lymph nodes, unspecified: Secondary | ICD-10-CM

## 2015-07-30 DIAGNOSIS — R222 Localized swelling, mass and lump, trunk: Secondary | ICD-10-CM

## 2015-07-30 NOTE — Telephone Encounter (Signed)
Called patient -- [facility] will not see her for her back mass since it is not cancer. I called Dr. Christene Slates and spoke with him - we have her scheduled for a lymph node bx on 9/22 at 9 AM. Patient is wary about doing this d/t price. I have emphasized the importance of getting this done. I advised at this time, bx of back mass can wait since looks benign according to CT and MRI. She should get lymph node bx now. She will call with further concerns.

## 2015-08-18 ENCOUNTER — Telehealth: Payer: Self-pay

## 2015-08-18 NOTE — Telephone Encounter (Signed)
[facility] called to let nicole that patient did not show for her appt   and when Janett Billow called patient to reschedule pt did not want to reschedule  Best number [phone] Janett Billow

## 2015-08-24 NOTE — Telephone Encounter (Signed)
I have sent message to [name] to give pt a call and see if we can convince her of the importance of this procedure. [name] has also been involved in the care of this pt.

## 2015-11-08 IMAGING — CT CT CHEST W/ CM
2 of 4 series · 14 of 46 positions shown, 16 images · IV contrast (APPLIED)
Comparison: None

CLINICAL DATA: LEFT axillary adenopathy, recent ear infection and
tonsillar enlargement. Past history malignant brain tumor removed
[id number] (type not specified)

EXAM:
CT CHEST, ABDOMEN, AND PELVIS WITH CONTRAST
TECHNIQUE: Multidetector CT imaging of the chest, abdomen and pelvis was
performed following the standard protocol during bolus
administration of intravenous contrast. Sagittal and coronal MPR
images reconstructed from axial data set.
CONTRAST:  100mL [id number] IOPAMIDOL (J8XDSL-0XX) INJECTION 61% IV.
Dilute oral contrast.

[Series 2: chest/abd/pelvis w/cm · axial · 0.76mm/px · z∈[-608,-38]mm · 11 of 126 slices shown, 13 images]
[im 6/126  soft-tissue]
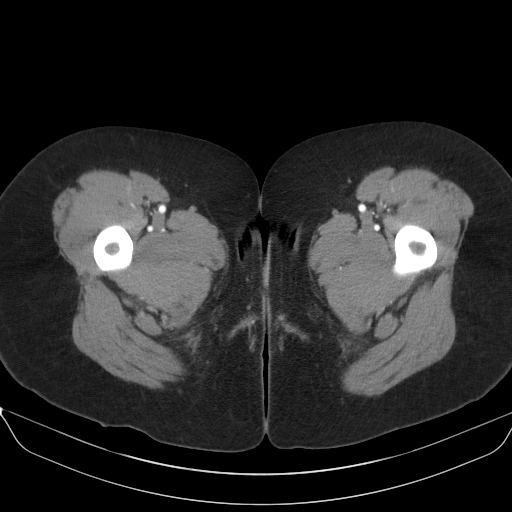
[im 6/126  bone]
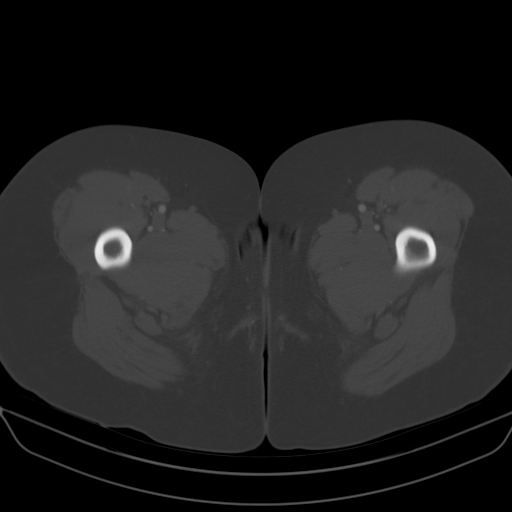
[im 18/126  soft-tissue]
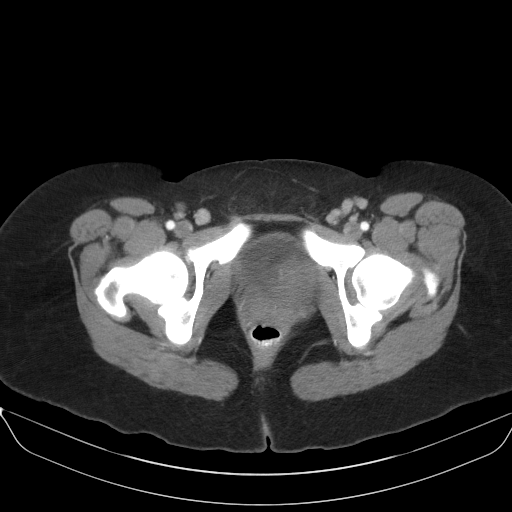
[im 30/126  soft-tissue]
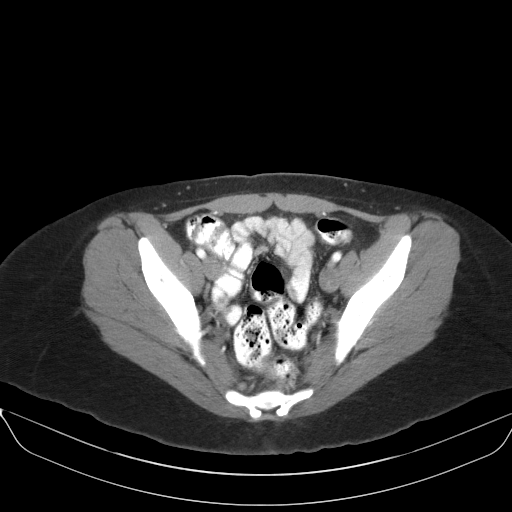
[im 42/126  soft-tissue]
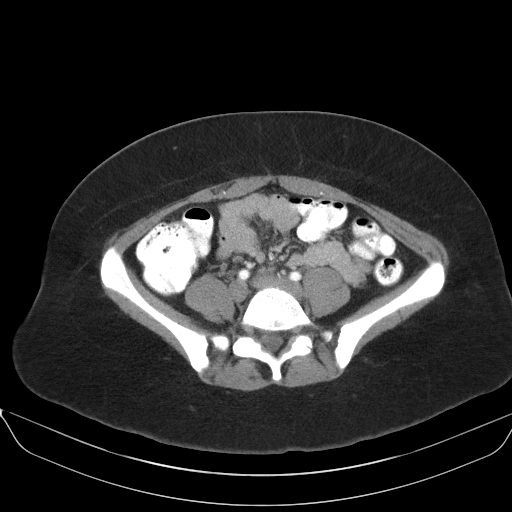
[im 54/126  soft-tissue]
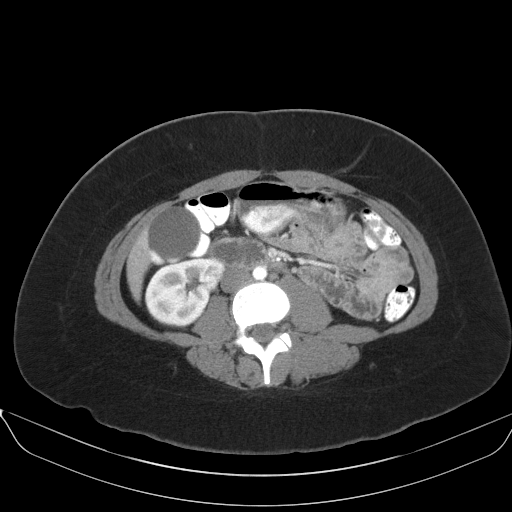
[im 66/126  soft-tissue]
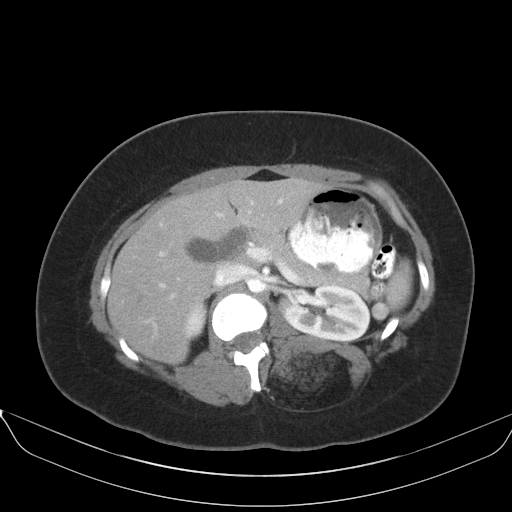
[im 72/126  soft-tissue]
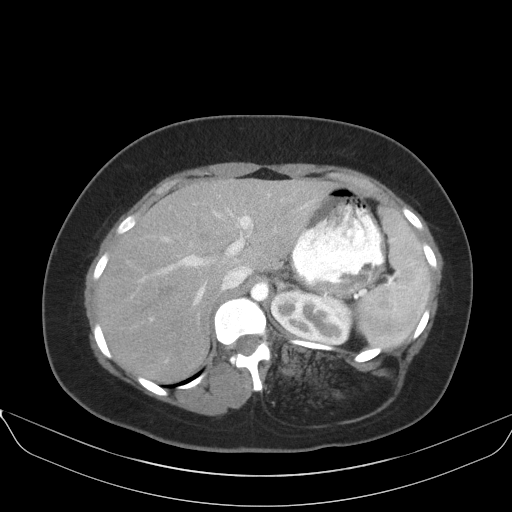
[im 84/126  soft-tissue]
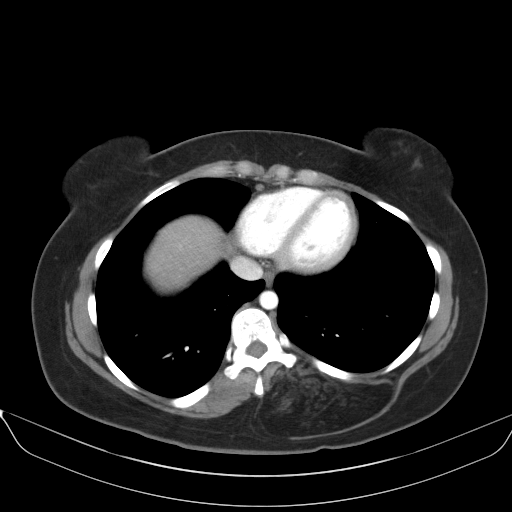
[im 96/126  soft-tissue]
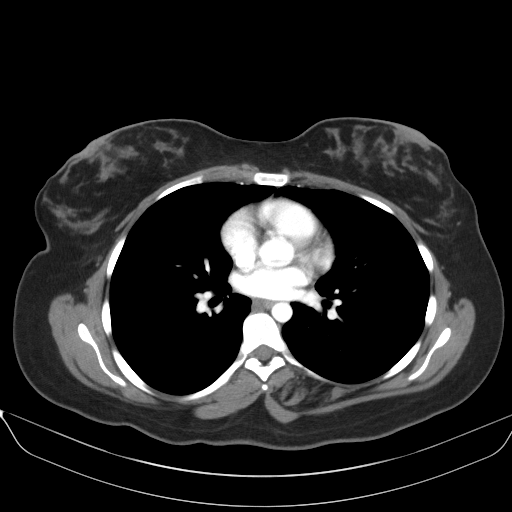
[im 96/126  bone]
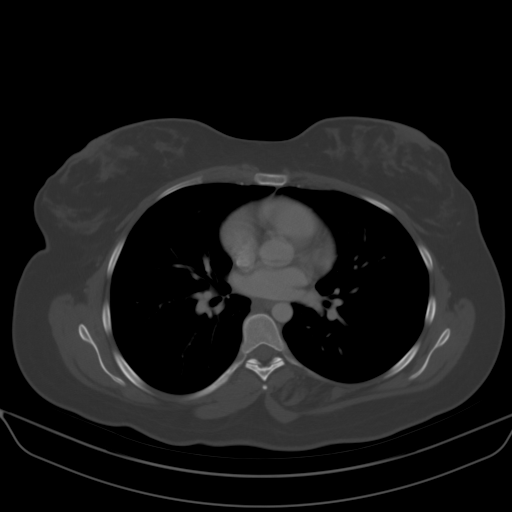
[im 108/126  soft-tissue]
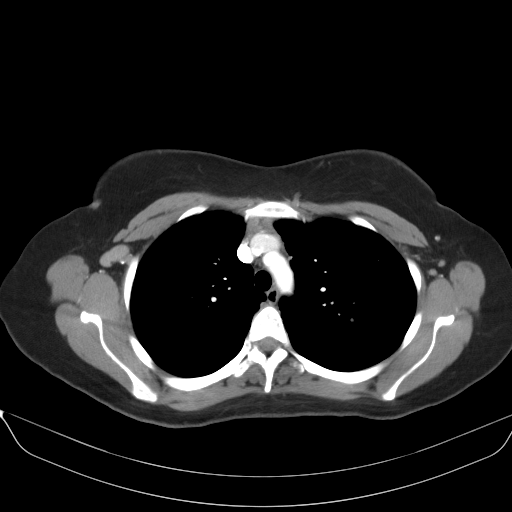
[im 120/126  soft-tissue]
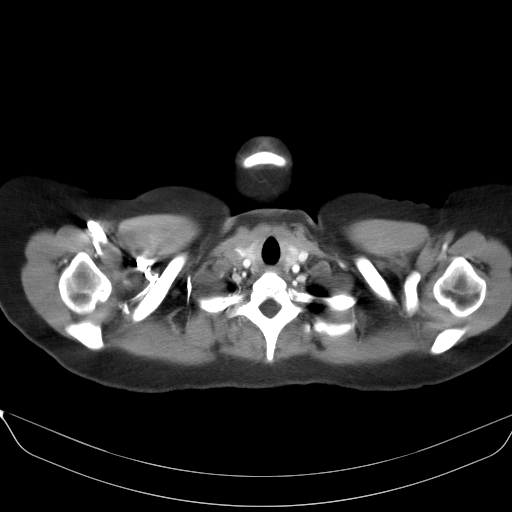

[Series 3: cor · coronal · 0.78mm/px · 3 of 74 slices shown]
[im 25/74  soft-tissue]
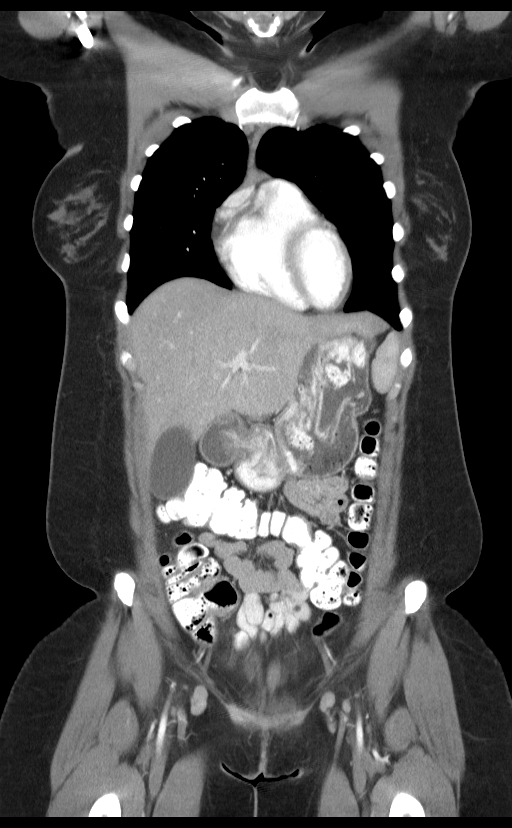
[im 33/74  soft-tissue]
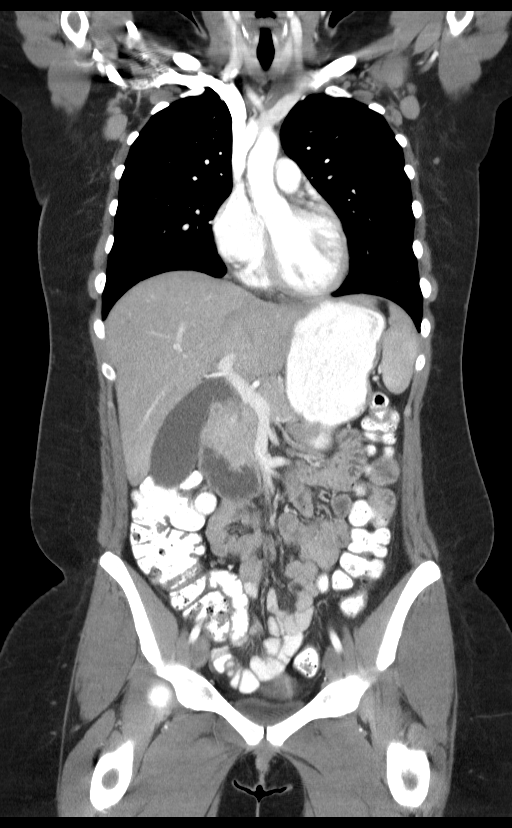
[im 41/74  soft-tissue]
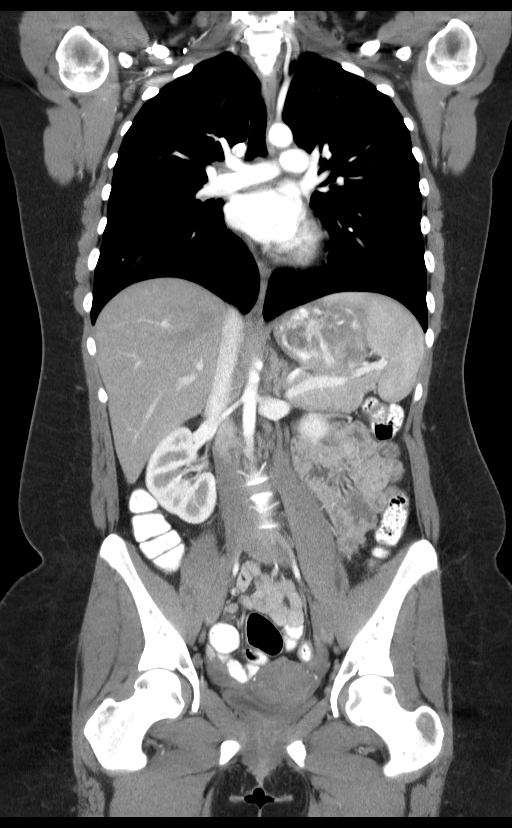

[14 of 46 positions shown; findings below may reference images not displayed]

FINDINGS: CT CHEST FINDINGS

Thoracic vascular structures grossly patent on nondedicated exam.

Triangular soft tissue in anterior mediastinum, favor residual
thymic tissue.

Normal sized axillary lymph nodes bilaterally measuring up to 12 mm
short axis on LEFT and 10 mm on RIGHT.

No mediastinal or hilar adenopathy.

Lungs clear.

No pleural effusion, pneumothorax or mass/nodule.

Fat attenuation enlargement identified within the LEFT paraspinal
muscles extending from T7 to L3, measuring 6.2 x 11.5 cm in greatest
axial dimensions and extending 24.0 cm length.

Coronal images demonstrate a focus of suspected fascial rupture
laterally and extension of fat as well as a probable small area of
fascial defect posteriorly on sagittal images.

No definite intra thoracic extension.

The fat attenuation within the paraspinal muscles internally shows
linear traversing muscle bundles throughout.

Associated mild dextro convex rotary scoliosis of the thoracolumbar
spine.

CT ABDOMEN AND PELVIS FINDINGS

Liver, gallbladder, spleen, pancreas, kidneys, and adrenal glands
normal.

Normal appendix.

Unremarkable uterus, and adnexa, decompressed bladder and ureters.

Stomach and bowel loops unremarkable.

No mass, adenopathy, free air, free fluid, or abdominal/pelvic
hernia.

Hypertrophic appearance of the LEFT T11-T12 facet joint with
resultant marked neural foraminal stenosis with question of
associated neural compression raised.

No other definite focal bony abnormalities.
IMPRESSION: Axillary lymph nodes are seen bilaterally which are fairly
unremarkable measuring up to 12 mm short axis LEFT and 10 mm RIGHT.

No other adenopathy identified.

Significant fat attenuation enlargement of the posterior LEFT
paraspinal muscles of the thorax and upper abdomen, [DATE] x 11.5 x
24.0 cm in size containing areas of suspected fascial discontinuity
and extension of fat.

This could represent fatty pseudohypertrophy of LEFT paraspinal
muscles, which can occur in the setting of denervation such as from
potentially marked neural foraminal stenosis at LEFT T11-T12 ; this
would be supported by the presence of normal-appearing traversing
muscle bundles throughout the fatty enlargement of the paraspinal
muscle without evidence of discrete soft tissue mass or mass effect.

However, fat attenuation mass is not excluded and MR imaging with
and without contrast is recommended to exclude large intramuscular
lipoma and liposarcoma.

## 2016-03-21 ENCOUNTER — Ambulatory Visit (HOSPITAL_COMMUNITY)
Admission: RE | Admit: 2016-03-21 | Discharge: 2016-03-21 | Disposition: A | Discharge status: Home / Self Care | Payer: Commercial Managed Care - HMO | Attending: Psychiatry | Admitting: Psychiatry

## 2016-03-21 ENCOUNTER — Encounter (HOSPITAL_COMMUNITY): Payer: Self-pay | Admitting: Emergency Medicine

## 2016-03-21 DIAGNOSIS — F17210 Nicotine dependence, cigarettes, uncomplicated: Secondary | ICD-10-CM | POA: Diagnosis not present

## 2016-03-21 DIAGNOSIS — F419 Anxiety disorder, unspecified: Secondary | ICD-10-CM | POA: Insufficient documentation

## 2016-03-21 DIAGNOSIS — F329 Major depressive disorder, single episode, unspecified: Secondary | ICD-10-CM | POA: Insufficient documentation

## 2016-03-21 HISTORY — DX: Other specified health status: Z78.9
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
## 2016-09-19 ENCOUNTER — Ambulatory Visit (INDEPENDENT_AMBULATORY_CARE_PROVIDER_SITE_OTHER): Payer: Self-pay

## 2016-09-19 ENCOUNTER — Ambulatory Visit (INDEPENDENT_AMBULATORY_CARE_PROVIDER_SITE_OTHER): Payer: Self-pay | Admitting: Family Medicine

## 2016-09-19 VITALS — BP 110/70 | HR 114 | Temp 99.0°F | Resp 18 | Ht 63.5 in | Wt 183.0 lb

## 2016-09-19 DIAGNOSIS — K529 Noninfective gastroenteritis and colitis, unspecified: Secondary | ICD-10-CM

## 2016-09-19 DIAGNOSIS — R112 Nausea with vomiting, unspecified: Secondary | ICD-10-CM

## 2016-09-19 DIAGNOSIS — E860 Dehydration: Secondary | ICD-10-CM

## 2016-09-19 DIAGNOSIS — R1012 Left upper quadrant pain: Secondary | ICD-10-CM

## 2016-09-19 LAB — POCT CBC
Granulocyte percent: 69.5 %G (ref 37–80)
HCT, POC: 33.6 % — AB (ref 37.7–47.9)
Hemoglobin: 11.6 g/dL — AB (ref 12.2–16.2)
Lymph, poc: 1.8 (ref 0.6–3.4)
MCH, POC: 27.1 pg (ref 27–31.2)
MCHC: 34.6 g/dL (ref 31.8–35.4)
MCV: 78.2 fL — AB (ref 80–97)
MID (cbc): 0.7 (ref 0–0.9)
MPV: 7.1 fL (ref 0–99.8)
POC Granulocyte: 5.8 (ref 2–6.9)
POC LYMPH PERCENT: 22.2 %L (ref 10–50)
POC MID %: 8.3 %M (ref 0–12)
Platelet Count, POC: 146 10*3/uL (ref 142–424)
RBC: 4.3 M/uL (ref 4.04–5.48)
RDW, POC: 13.8 %
WBC: 8.3 10*3/uL (ref 4.6–10.2)

## 2016-09-19 LAB — POC MICROSCOPIC URINALYSIS (UMFC): MUCUS RE: ABSENT

## 2016-09-19 LAB — COMPREHENSIVE METABOLIC PANEL
ALT: 22 U/L (ref 6–29)
AST: 35 U/L — AB (ref 10–30)
Albumin: 3.8 g/dL (ref 3.6–5.1)
Alkaline Phosphatase: 79 U/L (ref 33–115)
BILIRUBIN TOTAL: 0.6 mg/dL (ref 0.2–1.2)
BUN: 11 mg/dL (ref 7–25)
CO2: 20 mmol/L (ref 20–31)
Calcium: 8.8 mg/dL (ref 8.6–10.2)
Chloride: 99 mmol/L (ref 98–110)
Creat: 0.77 mg/dL (ref 0.50–1.10)
GLUCOSE: 83 mg/dL (ref 65–99)
Potassium: 4 mmol/L (ref 3.5–5.3)
SODIUM: 132 mmol/L — AB (ref 135–146)
Total Protein: 8.2 g/dL — ABNORMAL HIGH (ref 6.1–8.1)

## 2016-09-19 LAB — POCT URINALYSIS DIP (MANUAL ENTRY)
Bilirubin, UA: NEGATIVE
GLUCOSE UA: NEGATIVE
Ketones, POC UA: NEGATIVE
LEUKOCYTES UA: NEGATIVE
Nitrite, UA: NEGATIVE
UROBILINOGEN UA: 1
pH, UA: 5.5

## 2016-09-19 LAB — LIPASE: Lipase: 29 U/L (ref 7–60)

## 2016-09-19 LAB — POCT URINE PREGNANCY: PREG TEST UR: NEGATIVE

## 2016-09-19 MED ORDER — ONDANSETRON 4 MG PO TBDP
8.0000 mg | ORAL_TABLET | Freq: Once | ORAL | Status: AC
  Administered 2016-09-19: 8 mg via ORAL

## 2016-09-19 MED ORDER — PROMETHAZINE HCL 25 MG PO TABS: 25.0000 mg | ORAL_TABLET | Freq: Three times a day (TID) | ORAL | 0 refills | Status: AC | PRN

## 2016-09-19 NOTE — Patient Instructions (Addendum)
IF you received an x-ray today, you will receive an invoice from [facility]. Please contact [facility] at [phone] with questions or concerns regarding your invoice.   IF you received labwork today, you will receive an invoice from [employer]. Please contact [facility] at [phone] with questions or concerns regarding your invoice.   Our billing staff will not be able to assist you with questions regarding bills from these companies.  You will be contacted with the lab results as soon as they are available. The fastest way to get your results is to activate your My Chart account. Instructions are located on the last page of this paperwork. If you have not heard from [place] regarding the results in 2 weeks, please contact this office.    Viral Gastroenteritis Viral gastroenteritis is also known as stomach flu. This condition affects the stomach and intestinal tract. It can cause sudden diarrhea and vomiting. The illness typically lasts 3 to 8 days. Most people develop an immune response that eventually gets rid of the virus. While this natural response develops, the virus can make you quite ill. CAUSES  Many different viruses can cause gastroenteritis, such as rotavirus or noroviruses. You can catch one of these viruses by consuming contaminated food or water. You may also catch a virus by sharing utensils or other personal items with an infected person or by touching a contaminated surface. SYMPTOMS  The most common symptoms are diarrhea and vomiting. These problems can cause a severe loss of body fluids (dehydration) and a body salt (electrolyte) imbalance. Other symptoms may include:  Fever.  Headache.  Fatigue.  Abdominal pain. DIAGNOSIS  Your caregiver can usually diagnose viral gastroenteritis based on your symptoms and a physical exam. A stool sample may also be taken to test for the presence of viruses or other  infections. TREATMENT  This illness typically goes away on its own. Treatments are aimed at rehydration. The most serious cases of viral gastroenteritis involve vomiting so severely that you are not able to keep fluids down. In these cases, fluids must be given through an intravenous line (IV). HOME CARE INSTRUCTIONS   Drink enough fluids to keep your urine clear or pale yellow. Drink small amounts of fluids frequently and increase the amounts as tolerated.  Ask your caregiver for specific rehydration instructions.  Avoid:  Foods high in sugar.  Alcohol.  Carbonated drinks.  Tobacco.  Juice.  Caffeine drinks.  Extremely hot or cold fluids.  Fatty, greasy foods.  Too much intake of anything at one time.  Dairy products until 24 to 48 hours after diarrhea stops.  You may consume probiotics. Probiotics are active cultures of beneficial bacteria. They may lessen the amount and number of diarrheal stools in adults. Probiotics can be found in yogurt with active cultures and in supplements.  Wash your hands well to avoid spreading the virus.  Only take over-the-counter or prescription medicines for pain, discomfort, or fever as directed by your caregiver. Do not give aspirin to children. Antidiarrheal medicines are not recommended.  Ask your caregiver if you should continue to take your regular prescribed and over-the-counter medicines.  Keep all follow-up appointments as directed by your caregiver. SEEK IMMEDIATE MEDICAL CARE IF:   You are unable to keep fluids down.  You do not urinate at least once every 6 to 8 hours.  You develop shortness of breath.  You notice blood in your stool or vomit. This may look like coffee grounds.  You have abdominal pain that increases or is concentrated in one small area (localized).  You have persistent vomiting or diarrhea.  You have a fever.  The patient is a child younger than 3 months, and he or she has a fever.  The patient  is a child older than 3 months, and he or she has a fever and persistent symptoms.  The patient is a child older than 3 months, and he or she has a fever and symptoms suddenly get worse.  The patient is a baby, and he or she has no tears when crying. MAKE SURE YOU:   Understand these instructions.  Will watch your condition.  Will get help right away if you are not doing well or get worse.   This information is not intended to replace advice given to you by your health care provider. Make sure you discuss any questions you have with your health care provider.   Document Released: 11/06/2005 Document Revised: 01/29/2012 Document Reviewed: 08/23/2011 Elsevier Interactive Patient Education [employer].

## 2016-09-19 NOTE — Progress Notes (Signed)
Subjective:  This chart was scribed for Delman Cheadle MD, by Tamsen Roers, at [facility].  This patient was seen in room 3 and the patient's care was started at 9:51 AM.   Chief Complaint  Patient presents with  . Fever  . Chills  . Abdominal Pain     Patient ID: Virginia Holt, female    DOB: 23-Apr-1991, 23 y.o.   MRN: [id number]  HPI HPI Comments: Virginia Holt is a 25 y.o. female who presents to the [facility] complaining of a subjective fever/chills and left upper quadrant abdominal pain onset three days ago.  She has associated symptoms of lightheadedness/dizziness, nausea/vomitting,dark urine and loss of appetite.   Her symptoms initially started with abdominal pain and she was not able to "hold anything down" when she tried to eat. Her last episode of vomiting was yesterday afternoon (which appeared to have food in it- without blood).  She has been able to keep down some water (1 bottle this morning)  but does not have an appetite so she has not tried to eat much.  She denies any diarrhea, dysuria or frequency, indigestion/heartburn.  Patient started her period today and has been having regular menstrual cycles. Denies a possibility of pregnancy and is not sexually active.  Denies any past surgical history on her abdomen. Patient ate chicken on three days ago and believes that it was not completely cooked.   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    Patient received her flu shot this year.    11:11 AM Recheck: persistent nausea, no change in symptoms. Difficulty starting IV. patient has only received 100 cc's of fluid and zofran 8. Patient reports she has a bowel movements 1 time per week at baseline but without straining or hard stool. She had some mild epigastric tenderness and increased bowel sounds.       Past Medical History:  Diagnosis Date  . Cancer ([place])   . Medical history non-contributory       Current Outpatient Prescriptions on File Prior to Visit  Medication Sig Dispense Refill  . ibuprofen (ADVIL,MOTRIN) 200 MG tablet Take 400 mg by mouth every 6 (six) hours as needed.     No current facility-administered medications on file prior to visit.     No Known Allergies  Past Surgical History:  Procedure Laterality Date  . BRAIN SURGERY    . TYMPANOPLASTY Right    History reviewed. No pertinent family history. Social History   Social History  . Marital status: Single    Spouse name: N/A  . Number of children: N/A  . Years of education: N/A   Social History Main Topics  . Smoking status: Current Every Day Smoker    Packs/day: 0.50    Types: Cigarettes  . Smokeless tobacco: Never Used  . Alcohol use 0.0 oz/week     Comment: social use  . Drug use: No  . Sexual activity: Not Currently   Other Topics Concern  . None   Social History Narrative  . None   Depression screen [facility] 2/9 09/19/2016 06/21/2015 05/12/2015 05/06/2015 04/27/2015  Decreased Interest 0 0 0 0 0  Down, Depressed, Hopeless 0 0 0 0 0  PHQ - 2 Score 0 0 0 0 0     Review of Systems  Constitutional: Positive for appetite change, chills and fever.  Eyes: Negative for pain, redness and itching.  Respiratory: Negative for cough,  choking and shortness of breath.   Gastrointestinal: Positive for abdominal pain, nausea and vomiting. Negative for blood in stool, constipation and diarrhea.  Genitourinary: Negative for difficulty urinating, dysuria and frequency.  Musculoskeletal: Negative for neck pain and neck stiffness.  Neurological: Positive for light-headedness. Negative for speech difficulty.       Objective:   Physical Exam  Constitutional: She is oriented to person, place, and time.  HENT:  Head: Normocephalic and atraumatic.  Cardiovascular: Regular rhythm, S1 normal and S2 normal.   Tachycardic but regular rhythm.   Pulmonary/Chest: Effort normal and breath sounds normal. No respiratory  distress. She has no wheezes. She has no rales.  Abdominal: Bowel sounds are normal. There is no CVA tenderness.  Neurological: She is alert and oriented to person, place, and time.  Skin:  Clammy and diaphoretic.   Psychiatric: She has a normal mood and affect. Her behavior is normal.   Vitals:   09/19/16 0903  BP: 110/70  Pulse: (!) 114  Resp: 18  Temp: 99 F (37.2 C)  TempSrc: Oral  SpO2: 98%  Weight: 183 lb (83 kg)  Height: 5' 3.5" (1.613 m)    Results for orders placed or performed in visit on 09/19/16  POCT CBC  Result Value Ref Range   WBC 8.3 4.6 - 10.2 K/uL   Lymph, poc 1.8 0.6 - 3.4   POC LYMPH PERCENT 22.2 10 - 50 %L   MID (cbc) 0.7 0 - 0.9   POC MID % 8.3 0 - 12 %M   POC Granulocyte 5.8 2 - 6.9   Granulocyte percent 69.5 37 - 80 %G   RBC 4.30 4.04 - 5.48 M/uL   Hemoglobin 11.6 (A) 12.2 - 16.2 g/dL   HCT, POC 33.6 (A) 37.7 - 47.9 %   MCV 78.2 (A) 80 - 97 fL   MCH, POC 27.1 27 - 31.2 pg   MCHC 34.6 31.8 - 35.4 g/dL   RDW, POC 13.8 %   Platelet Count, POC 146 142 - 424 K/uL   MPV 7.1 0 - 99.8 fL  POCT urinalysis dipstick  Result Value Ref Range   Color, UA yellow yellow   Clarity, UA clear clear   Glucose, UA negative negative   Bilirubin, UA negative negative   Ketones, POC UA negative negative   Spec Grav, UA <=1.005    Blood, UA trace-intact (A) negative   pH, UA 5.5    Protein Ur, POC trace (A) negative   Urobilinogen, UA 1.0    Nitrite, UA Negative Negative   Leukocytes, UA Negative Negative  POCT urine pregnancy  Result Value Ref Range   Preg Test, Ur Negative Negative  POCT Microscopic Urinalysis ([facility])  Result Value Ref Range   WBC,UR,HPF,POC Few (A) None WBC/hpf   RBC,UR,HPF,POC None None RBC/hpf   Bacteria Moderate (A) None, Too numerous to count   Mucus Absent Absent   Epithelial Cells, UR Per Microscopy Few (A) None, Too numerous to count cells/hpf    Dg Abd Acute W/chest  Result Date: 09/19/2016 CLINICAL DATA:  Epigastric pain,  left upper quadrant pain, nausea and vomiting for 3 days EXAM: DG ABDOMEN ACUTE W/ 1V CHEST COMPARISON:  05/06/2015 FINDINGS: There is no evidence of dilated bowel loops or free intraperitoneal air. No radiopaque calculi or other significant radiographic abnormality is seen. Heart size and mediastinal contours are within normal limits. Both lungs are clear. There is dextroscoliosis of the lumbar spine. IMPRESSION: Negative abdominal radiographs. No acute cardiopulmonary disease.  Dextroscoliosis lumbar spine. Electronically Signed   By: Lahoma Crocker M.D.   On: 09/19/2016 11:27        Assessment & Plan:   1. Left upper quadrant pain   2. Dehydration   3. Non-intractable vomiting with nausea, unspecified vomiting type   4. Noninfectious gastroenteritis, unspecified type     Orders Placed This Encounter  Procedures  . Urine culture  . DG Abd Acute W/Chest    Standing Status:   Future    Number of Occurrences:   1    Standing Expiration Date:   09/19/2017    Order Specific Question:   Reason for exam:    Answer:   epigastric and LUQ abd pain, nausea, vomiting    Order Specific Question:   Is the patient pregnant?    Answer:   No    Order Specific Question:   Preferred imaging location?    Answer:   External  . Comprehensive metabolic panel  . Lipase  . POCT CBC  . POCT urinalysis dipstick  . POCT urine pregnancy  . POCT Microscopic Urinalysis ([facility])  . Insert peripheral IV    Meds ordered this encounter  Medications  . ondansetron (ZOFRAN-ODT) disintegrating tablet 8 mg  . promethazine (PHENERGAN) 25 MG tablet    Sig: Take 1 tablet (25 mg total) by mouth every 8 (eight) hours as needed for nausea or vomiting.    Dispense:  20 tablet    Refill:  0    I personally performed the services described in this documentation, which was scribed in my presence. The recorded information has been reviewed and considered, and addended by me as needed.   Delman Cheadle, M.D.  [facility] [address] [place], [place] [address] [phone] phone [phone] fax  09/22/16 10:19 PM

## 2016-09-20 ENCOUNTER — Ambulatory Visit (INDEPENDENT_AMBULATORY_CARE_PROVIDER_SITE_OTHER): Payer: Self-pay | Admitting: Family Medicine

## 2016-09-20 ENCOUNTER — Telehealth: Payer: Self-pay

## 2016-09-20 VITALS — BP 110/66 | HR 100 | Temp 98.6°F | Resp 16 | Ht 64.5 in | Wt 180.0 lb

## 2016-09-20 DIAGNOSIS — R1013 Epigastric pain: Secondary | ICD-10-CM

## 2016-09-20 DIAGNOSIS — H8101 Meniere's disease, right ear: Secondary | ICD-10-CM

## 2016-09-20 DIAGNOSIS — J069 Acute upper respiratory infection, unspecified: Secondary | ICD-10-CM

## 2016-09-20 LAB — URINE CULTURE

## 2016-09-20 MED ORDER — MECLIZINE HCL 25 MG PO TABS: 25.0000 mg | ORAL_TABLET | Freq: Three times a day (TID) | ORAL | 0 refills | Status: AC | PRN

## 2016-09-20 MED ORDER — AZITHROMYCIN 250 MG PO TABS: ORAL_TABLET | ORAL | 0 refills | Status: AC

## 2016-09-20 MED ORDER — GUAIFENESIN-CODEINE 100-10 MG/5ML PO SOLN: 5.0000 mL | Freq: Four times a day (QID) | ORAL | 0 refills | Status: AC | PRN

## 2016-09-20 NOTE — Progress Notes (Signed)
Subjective:  By signing my name below, I, Virginia Holt, attest that this documentation has been prepared under the direction and in the presence of Delman Cheadle, MD Electronically Signed: Ladene Holt, ED Scribe 09/20/2016 at 9:57 AM.   Patient ID: Virginia Holt, female    DOB: January 18, 1991, 25 y.o.   MRN: [id number]  Chief Complaint  Patient presents with  . Follow-up    feeling the same but has hard cough.   HPI HPI Comments: Virginia Holt is a 25 y.o. female who presents to the [facility] for a follow-up. Pt states that she is still experiencing some nasal congestion, sore throat, dizziness with changing positions and at rest but resolves with closing her eyes, gradually improving nausea, loss of appetite, nonexertional LUQ abdominal pain onset 4 days ago and has had 1 BM since yesterday. Pt denies vomiting since yesterday. Pt has tried NyQuil, Aleve, allergy medications and Phenergan with mild relief. Pt reports a h/o mono.   Past Medical History:  Diagnosis Date  . Cancer ([place])   . Medical history non-contributory    Current Outpatient Prescriptions on File Prior to Visit  Medication Sig Dispense Refill  . ibuprofen (ADVIL,MOTRIN) 200 MG tablet Take 400 mg by mouth every 6 (six) hours as needed.    . promethazine (PHENERGAN) 25 MG tablet Take 1 tablet (25 mg total) by mouth every 8 (eight) hours as needed for nausea or vomiting. 20 tablet 0   No current facility-administered medications on file prior to visit.    No Known Allergies  Review of Systems  Constitutional: Positive for appetite change.  HENT: Positive for congestion and sore throat.   Gastrointestinal: Positive for abdominal pain and nausea. Negative for vomiting.  Neurological: Positive for dizziness.   BP 110/66 (BP Location: Right Arm, Cuff Size: Normal)   Pulse 100   Temp 98.6 F (37 C) (Oral)   Resp 16   Ht 5' 4.5" (1.638 m)   Wt 180 lb (81.6 kg)   LMP 09/19/2016   SpO2 98%   BMI  30.42 kg/m       Objective:   Physical Exam  Constitutional: She is oriented to person, place, and time. She appears well-developed and well-nourished. No distress.  HENT:  Head: Normocephalic and atraumatic.  Eyes: Conjunctivae are normal. Right eye exhibits nystagmus (vertical). Left eye exhibits no nystagmus.  Neck: Neck supple. No tracheal deviation present.  Cardiovascular: Regular rhythm, S1 normal and S2 normal.  Tachycardia present.   Pulmonary/Chest: Effort normal and breath sounds normal. No respiratory distress.  Lungs are clear to auscultation.   Abdominal: Soft. Bowel sounds are decreased. There is tenderness in the epigastric area, left upper quadrant and left lower quadrant. There is no rebound and no guarding.  Musculoskeletal: Normal range of motion.  Lymphadenopathy:    She has cervical adenopathy (anterior, R worse than L).  Neurological: She is alert and oriented to person, place, and time.  Skin: Skin is warm and dry.  Psychiatric: She has a normal mood and affect. Her behavior is normal.  Nursing note and vitals reviewed.     Assessment & Plan:   1. Vertigo, labyrinthine, right   2. Upper respiratory tract infection, unspecified type   3. Epigastric abdominal pain   Gave nexium samples for her to use for several days - suspect epigastric pain might be a combo of decreased po with illness, dehydration, otc cough/cold meds, post-nasal drip, etc.  Still dehydrated - push fluids. Gave  snap rx for zpack in case URI sxs worsen but advised pt that currently appears to be viral and I suspect she will turn the corner today or tomorrow. - can likely go back to work on Fri. Likely has underlying chronic constipation - increase fiber and water.  Meds ordered this encounter  Medications  . guaiFENesin-codeine 100-10 MG/5ML syrup    Sig: Take 5 mLs by mouth every 6 (six) hours as needed for cough.    Dispense:  120 mL    Refill:  0  . azithromycin (ZITHROMAX) 250 MG  tablet    Sig: Take 2 tabs PO x 1 dose, then 1 tab PO QD x 4 days    Dispense:  6 tablet    Refill:  0  . meclizine (ANTIVERT) 25 MG tablet    Sig: Take 1 tablet (25 mg total) by mouth 3 (three) times daily as needed for dizziness.    Dispense:  30 tablet    Refill:  0    I personally performed the services described in this documentation, which was scribed in my presence. The recorded information has been reviewed and considered, and addended by me as needed.   Delman Cheadle, M.D.  [facility] [address] [place], [place] [address] [phone] phone [phone] fax  09/20/16 10:48 AM

## 2016-09-20 NOTE — Patient Instructions (Addendum)
Food Choices for Peptic Ulcer Disease When you have peptic ulcer disease, the foods you eat and your eating habits are very important. Choosing the right foods can help ease the discomfort of peptic ulcer disease. WHAT GENERAL GUIDELINES DO I NEED TO FOLLOW?  Choose fruits, vegetables, whole grains, and low-fat meat, fish, and poultry.   Keep a food diary to identify foods that cause symptoms.  Avoid foods that cause irritation or pain. These may be different for different people.  Eat frequent small meals instead of three large meals each day. The pain may be worse when your stomach is empty.  Avoid eating close to bedtime. WHAT FOODS ARE NOT RECOMMENDED? The following are some foods and drinks that may worsen your symptoms:  Black, white, and red pepper.  Hot sauce.  Chili peppers.  Chili powder.  Chocolate and cocoa.   Alcohol.  Tea, coffee, and cola (regular and decaffeinated). The items listed above may not be a complete list of foods and beverages to avoid. Contact your dietitian for more information.   This information is not intended to replace advice given to you by your health care provider. Make sure you discuss any questions you have with your health care provider.   Document Released: 01/29/2012 Document Revised: 11/11/2013 Document Reviewed: 09/10/2013 Elsevier Interactive Patient Education [employer].    IF you received an x-ray today, you will receive an invoice from [facility]. Please contact [facility] at [phone] with questions or concerns regarding your invoice.   IF you received labwork today, you will receive an invoice from [employer]. Please contact [facility] at [phone] with questions or concerns regarding your invoice.   Our billing staff will not be able to assist you with questions regarding bills from these companies.  You will be contacted with the lab results as soon as they  are available. The fastest way to get your results is to activate your My Chart account. Instructions are located on the last page of this paperwork. If you have not heard from [place] regarding the results in 2 weeks, please contact this office.    Vertigo Vertigo means you feel like you or your surroundings are moving when they are not. Vertigo can be dangerous if it occurs when you are at work, driving, or performing difficult activities.  CAUSES  Vertigo occurs when there is a conflict of signals sent to your brain from the visual and sensory systems in your body. There are many different causes of vertigo, including:  Infections, especially in the inner ear.  A bad reaction to a drug or misuse of alcohol and medicines.  Withdrawal from drugs or alcohol.  Rapidly changing positions, such as lying down or rolling over in bed.  A migraine headache.  Decreased blood flow to the brain.  Increased pressure in the brain from a head injury, infection, tumor, or bleeding. SYMPTOMS  You may feel as though the world is spinning around or you are falling to the ground. Because your balance is upset, vertigo can cause nausea and vomiting. You may have involuntary eye movements (nystagmus). DIAGNOSIS  Vertigo is usually diagnosed by physical exam. If the cause of your vertigo is unknown, your caregiver may perform imaging tests, such as an MRI scan (magnetic resonance imaging). TREATMENT  Most cases of vertigo resolve on their own, without treatment. Depending on the cause, your caregiver may prescribe certain medicines. If your vertigo is related to body position issues, your caregiver may recommend  movements or procedures to correct the problem. In rare cases, if your vertigo is caused by certain inner ear problems, you may need surgery. HOME CARE INSTRUCTIONS   Follow your caregiver's instructions.  Avoid driving.  Avoid operating heavy machinery.  Avoid performing any tasks that would be  dangerous to you or others during a vertigo episode.  Tell your caregiver if you notice that certain medicines seem to be causing your vertigo. Some of the medicines used to treat vertigo episodes can actually make them worse in some people. SEEK IMMEDIATE MEDICAL CARE IF:   Your medicines do not relieve your vertigo or are making it worse.  You develop problems with talking, walking, weakness, or using your arms, hands, or legs.  You develop severe headaches.  Your nausea or vomiting continues or gets worse.  You develop visual changes.  A family member notices behavioral changes.  Your condition gets worse. MAKE SURE YOU:  Understand these instructions.  Will watch your condition.  Will get help right away if you are not doing well or get worse.   This information is not intended to replace advice given to you by your health care provider. Make sure you discuss any questions you have with your health care provider.   Document Released: 08/16/2005 Document Revised: 01/29/2012 Document Reviewed: 03/01/2015 Elsevier Interactive Patient Education [employer].

## 2016-09-20 NOTE — Telephone Encounter (Signed)
PATIENT STATES SHE SAW DR. SHAW TODAY AND SHE HAS A FEW QUESTIONS. SHE WOULD LIKE TO KNOW HOW LONG WILL THE DIETARY CHANGES HAVE TO LAST? SHE ALSO WANTS TO KNOW WHEN SHE CAN START BACK ON ASPIRIN? BEST PHONE [phone] (CELL)  [place]

## 2016-09-21 NOTE — Telephone Encounter (Signed)
Please advise length of time for diet changes and when to restart asa. (i didn't see in note)

## 2016-09-22 NOTE — Telephone Encounter (Signed)
2 weeks.  

## 2016-09-23 NOTE — Telephone Encounter (Signed)
Pt advised.

## 2019-01-02 ENCOUNTER — Telehealth: Payer: Self-pay | Admitting: General Practice

## 2019-01-02 NOTE — Telephone Encounter (Signed)
Copied from [place] [id number]. Topic: Medical Record Request - Patient ROI Request >> Jan 02, 2019 12:16 PM Bea Graff, NT wrote: Patient Name/DOB/MRN #: Karynn, Deblasi [id number]/ 17-Jan-1991 Requestor Name/Agency: self Call Back #: [phone] Information Requested: Requesting all medical records to [facility]. Fax#: [phone]    Route to [facility] for [place] clinics. For all other clinics, route to the clinic's PEC Pool.

## 2019-01-03 NOTE — Telephone Encounter (Signed)
Faxed to # given [phone]
